# Patient Record
Sex: Male | Born: 2000 | Race: White | Hispanic: No | Marital: Single | Smoking: Never smoker
Health system: Southern US, Community
[De-identification: ages and names within clinical notes are randomized; demographics above are authoritative.]

---
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
## 2020-11-26 ENCOUNTER — Telehealth (HOSPITAL_COMMUNITY): Payer: Self-pay | Admitting: Psychiatry

## 2020-11-26 NOTE — Telephone Encounter (Signed)
D:  Kari Baars (discharge coordinator) called from [facility] to refer to pt PHP.  After case manager asked a few questions, it was determined that the most appropriate fit would be CD-IOP d/t pt's past and current hx.  A:  Scheduled pt to meet with Fulton Reek, LCSW, LCAS on 12-04-20 @ 10 a.m; arrive at 9:15 a.m for paperwork; wearing a mask.  Instructed to bring the insurance card.  Medical Records were faxed to case manager as requested.

## 2020-12-04 ENCOUNTER — Ambulatory Visit (INDEPENDENT_AMBULATORY_CARE_PROVIDER_SITE_OTHER): Payer: 59 | Admitting: Licensed Clinical Social Worker

## 2020-12-04 ENCOUNTER — Other Ambulatory Visit: Payer: Self-pay

## 2020-12-04 DIAGNOSIS — F1994 Other psychoactive substance use, unspecified with psychoactive substance-induced mood disorder: Secondary | ICD-10-CM | POA: Diagnosis not present

## 2020-12-04 DIAGNOSIS — F1020 Alcohol dependence, uncomplicated: Secondary | ICD-10-CM

## 2020-12-04 DIAGNOSIS — F902 Attention-deficit hyperactivity disorder, combined type: Secondary | ICD-10-CM

## 2020-12-04 DIAGNOSIS — F1510 Other stimulant abuse, uncomplicated: Secondary | ICD-10-CM

## 2020-12-04 DIAGNOSIS — F411 Generalized anxiety disorder: Secondary | ICD-10-CM | POA: Diagnosis not present

## 2020-12-04 NOTE — Plan of Care (Signed)
See CCA 

## 2020-12-04 NOTE — Progress Notes (Deleted)
11/21/20 Sat night; drinking durring day (at least 1 week drinking black out nightly) snorted adderall; panic attacks/manic episode (yelling at everyone); grab knife cut wrist; saw ex gf tried to commit suicide; [facility] hosp Drinking heavily this summer "always black out drinker" daily this summer Started drinking at 9 Amphettamine/cocaine heavy use around Bday (adderall) August 2022; if drinking would do stimulants  Play lacross at school  Racing thoughts all life; 1 year ago similar behaviors called adhd Therapy 2021 1 session of therapy and doc; missed online mtgs  Alcohol 20 y/o it worked for depression freshamn yr hs; wkend drinking; College 3dayswk fall; 1 day wk spring due to [employer] workedin Arts development officer; 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

## 2020-12-06 NOTE — Progress Notes (Signed)
Comprehensive Clinical Assessment (CCA) Note  12/04/2020 [facility] [id number]  Chief Complaint:  Chief Complaint  Patient presents with   Addiction Problem   Anxiety   Visit Diagnosis: alcohol use disorder, stimulant use disorder, generalized anxiety disorder, major depressive disorder    CCA Screening, Triage and Referral (STR)  Patient Reported Information How did you hear about [place]? [facility] referred Referral name: [facility]  Referral phone number: No data recorded  Whom do you see for routine medical problems? No data recorded Practice/Facility Name: No data recorded Practice/Facility Phone Number: No data recorded Name of Contact: No data recorded Contact Number: No data recorded Contact Fax Number: No data recorded Prescriber Name: No data recorded Prescriber Address (if known): No data recorded  What Is the Reason for Your Visit/Call Today? No data recorded How Long Has This Been Causing You Problems? > than 6 months  What Do You Feel Would Help You the Most Today? No data recorded  Have You Recently Been in Any Inpatient Treatment (Hospital/Detox/Crisis [facility]/28-Day Program)? Yes  Name/Location of Program/Hospital:No data recorded How Long Were You There? No data recorded When Were You Discharged? No data recorded  Have You Ever Received Services From [facility] Before? No data recorded Who Do You See at [facility]? No data recorded  Have You Recently Had Any Thoughts About Hurting Yourself? Yes  Are You Planning to Commit Suicide/Harm Yourself At This time? No   Have you Recently Had Thoughts About Hurting Someone [facility]? No  Explanation: No data recorded  Have You Used Any Alcohol or Drugs in the Past 24 Hours? No  How Long Ago Did You Use Drugs or Alcohol? No data recorded What Did You Use and How Much? No data recorded  Do You Currently Have a Therapist/Psychiatrist? No  Name of Therapist/Psychiatrist: No data  recorded  Have You Been Recently Discharged From Any Office Practice or Programs? No  Explanation of Discharge From Practice/Program: No data recorded    CCA Screening Triage Referral Assessment Type of Contact: Face-to-Face  Is this Initial or Reassessment? No data recorded Date Telepsych consult ordered in [facility]:  No data recorded Time Telepsych consult ordered in [facility]:  No data recorded  Patient Reported Information Reviewed? No data recorded Patient Left Without Being Seen? No data recorded Reason for Not Completing Assessment: No data recorded  Collateral Involvement: EHR   Does Patient Have a Court Appointed Legal Guardian? No data recorded Name and Contact of Legal Guardian: No data recorded If Minor and Not Living with Parent(s), Who has Custody? No data recorded Is CPS involved or ever been involved? Never  Is APS involved or ever been involved? Never   Patient Determined To Be At Risk for Harm To Self or Others Based on Review of Patient Reported Information or Presenting Complaint? No  Method: No data recorded Availability of Means: No data recorded Intent: No data recorded Notification Required: No data recorded Additional Information for Danger to Others Potential: No data recorded Additional Comments for Danger to Others Potential: No data recorded Are There Guns or Other Weapons in Your Home? No data recorded Types of Guns/Weapons: No data recorded Are These Weapons Safely Secured?                            No data recorded Who Could Verify You Are Able To Have These Secured: No data recorded Do You Have any Outstanding Charges, Pending Court Dates,  Parole/Probation? No data recorded Contacted To Inform of Risk of Harm To Self or Others: No data recorded  Location of Assessment: Other (comment)   Does Patient Present under Involuntary Commitment? No  IVC Papers Initial File Date: No data recorded  [place] of Residence: [place]   Patient Currently  Receiving the Following Services: No data recorded  Determination of Need: No data recorded  Options For Referral: No data recorded    CCA Biopsychosocial Intake/Chief Complaint:  Carlos Casey is a 20 y/o male presenting as a referral following D/C from [facility]. Client was admtted for SI while intoxicated and is now taking a semester off school moving back in with parents to address MH/SU concerns. Client reporrted regular drinking since age 64, inreased durring summer 2022 up "black out drinking" daily. Starting August 2022 client started using cocaine and adderall while drinking.Client reported the night of the hospitalization he had been drinking daily and blacking out nightly on a binge for around 1 week (10drinks/night), had snorted adderall (around 30mg  conparied ot normal 10-15mg ) and started having panic attacks, yelling at everybody, cut wrist with knife and when saw ex-girlfriedn threatened to committ suicide. Client reported last suicideal thoughts were more than 1 year ago. Client reports medication helping mood symptoms but that he has had symtpoms of anxiety, depression, and racing thoughts "all life" but increased since freshman year of highschool. Client reported previous diagnosis of anxiety, depression, and ADHD.  Current Symptoms/Problems: Per self reported symptoms checklist client reported: depression, anxiety (especially social), mood swings, appetite changes, racing thoughts, memory problems, irritability, ecessive worrying, SIB, panic attacks, poor concentration, hyperactivity. Client reported majority symtpoms have improved with medication, specifically racing thoughts. Client reported presented problem as "severe anxiety that has resulted in several different problems, also quick to anger and quick to calm down" Client noted having to take a semester off due to substance use and constant worrying.   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   Mental Health Symptoms Depression:   Difficulty Concentrating; Irritability; Worthlessness; Tearfulness (sleep and appettite improved after stopping adderall; hopelessness prior to medication)   Duration of Depressive symptoms:  Greater than two weeks   Mania:   Change in energy/activity; Irritability   Anxiety:    Difficulty concentrating; Worrying; Restlessness; Irritability ("a ton of social anxiety" lead to drinking)   Psychosis:   None   Duration of Psychotic symptoms: No data recorded  Trauma:   None   Obsessions:   None   Compulsions:   None   Inattention:   None; Avoids/dislikes activities that require focus; Forgetful; Poor follow-through on tasks; Disorganized; Symptoms present in 2 or more settings   Hyperactivity/Impulsivity:   Feeling of restlessness; Symptoms present before age 58; Several symptoms present in 2 of more settings   Oppositional/Defiant Behaviors:   Temper   Emotional Irregularity:   Frantic efforts to avoid abandonment; Intense/inappropriate anger; Potentially harmful impulsivity; Recurrent suicidal behaviors/gestures/threats   Other Mood/Personality Symptoms:  No data recorded   Mental Status Exam Appearance and self-care  Stature:  Average   Weight:   Average weight   Clothing:   Age-appropriate   Grooming:   Normal   Cosmetic use:   None   Posture/gait:   Normal   Motor activity:   Restless   Sensorium  Attention:   Normal   Concentration:    Anxiety interferes   Orientation:   X5   Recall/memory:   Normal   Affect and Mood  Affect:   Anxious; Congruent   Mood:   Anxious (reports depression improved with medication)   Relating  Eye contact:   Normal   Facial expression:   Responsive   Attitude toward examiner:   Cooperative   Thought and Language  Speech flow:  Clear and Coherent   Thought content:   Appropriate to Mood and Circumstances   Preoccupation:   None   Hallucinations:   None   Organization:  No data recorded  [employer] of Knowledge:   Average   Intelligence:   Average   Abstraction:   Normal   Judgement:   Fair   Dance movement psychotherapist:   Realistic   Insight:   Fair   Decision Making:   Impulsive   Social Functioning  Social Maturity:   Impulsive   Social Judgement:   "Street Smart"   Stress  Stressors:   Family conflict; Financial; School (hx family stress with dad; finances: spent more money this summer than should have and didnt have enough for the year; wouldnt get work done to go drinking;)   Coping Ability:   Human resources officer Deficits:   Responsibility; Self-control; Interpersonal (social anxiety)   Supports:   Family; Friends/Service system (teammates, parents, sister, friends, Systems developer)     Religion: Religion/Spirituality Are You A Religious Person?: Yes How Might This Affect Treatment?: i want to become more religous; was more religous before college  Leisure/Recreation: Leisure / Recreation Do You Have Hobbies?: Yes Leisure and Hobbies: Copywriter, advertising, working out  Exercise/Diet: Exercise/Diet Do You Exercise?: Yes (great stress releiver) What Type of Exercise Do You Do?: Other (Comment), Weight Training (lacross, weight lifting) How Many Times a Week Do You Exercise?: 6-7 times a week Have You Gained or Lost A Significant Amount of Weight in the Past Six Months?: Yes-Gained ("healthy, its a good thing") Number of Pounds Gained:  10 Do You Follow a Special Diet?: No Do You Have Any Trouble Sleeping?: No (improved with stopping substances and started medication)   CCA Employment/Education Employment/Work Situation: Employment / Work Situation Employment Situation: Surveyor, minerals Job has Been Impacted by Current Illness: Yes Describe how Patient's Job has Been Impacted: out of work while in school; working over the summer in multiple cities (while away from home drank more, up to black out drinking daily) What is the Longest Time Patient has 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?: No  Education: Education Is Patient Currently Attending School?: Yes School Currently Attending: [employer] Last Grade Completed: 14 Name of High School: [employer] Did Garment/textile technologist From [employer]?: Yes Did Theme park manager?: Yes What Type of College Degree Do you Have?: pending Did You Attend Graduate School?: No What Was Your Major?: pending change Did You Have An Individualized Education Program (IIEP): No Did You Have Any Difficulty At School?: No Patient's Education Has Been Impacted by Current Illness:  Yes How Does Current Illness Impact Education?: inability to consentrate, not turning in work or turning in incomplete if time to drink   CCA Family/Childhood History Family and Relationship History: Family history Marital status: Single Are you sexually active?: Yes What is your sexual orientation?: straight Has your sexual activity been affected by drugs, alcohol, medication, or emotional stress?: little by alcohol; sleeping around not being safe Does patient have children?: No  Childhood History:  Childhood History By whom was/is the patient raised?: Both parents Additional childhood history information: [place] to [place] to  [facility]; moves to [place] since 20 y/o; moved due to parent job (dad Biomedical scientist; mom: Psychologist, occupational).   3 uncles hx addiction (paternal) Description of patient's relationship with caregiver when they were a child: mom was always great-similar personalities "tastes were similar" clt similar personality with dad and butted heads in highschool Patient's description of current relationship with people who raised him/her: possive, supportive with both How were you disciplined when you got in trouble as a child/adolescent?: appropriate Does patient have siblings?: Yes Number of Siblings: 1 Description of patient's current relationship with siblings: 54 y/o sister; get along well; ssiter may have depression Did patient suffer any verbal/emotional/physical/sexual abuse as a child?: No Did patient suffer from severe childhood neglect?: No Has patient ever been sexually abused/assaulted/raped as an adolescent or adult?: No Was the patient ever a victim of a crime or a disaster?: No Witnessed domestic violence?: No Has patient been affected by domestic violence as an adult?: No  Child/Adolescent Assessment:     CCA Substance Use Alcohol/Drug Use: Alcohol / Drug Use Pain Medications: none Prescriptions: seroquel, effexor, naltrexone Over the Counter: none History of alcohol / drug use?: Yes (started alcohol use age 36, increased in HS, increased again summer 22; 6 months f 5-7days/wk drinking; previously dranks less durring lacross season; abused adderall while drinking so could drink more; self medicating anxiety/social anxiety) Longest period of sobriety (when/how long): 1 week Negative Consequences of Use: Surveyor, quantity, Work / Programmer, multimedia (spent too much money while drinking; missing assigments) Withdrawal Symptoms: Blackouts, Tremors, Nausea / Vomiting Substance #1 Name of Substance 1: alcohol 1 - Age of First Use: 15 1 - Amount (size/oz): 8-10 drinks 1 - Frequency: almost daily 1  - Duration: 6 months increased (5 years total) 1 - Last Use / Amount: 11/22/20 1 - Method of Aquiring: parties/friends 1- Route of Use: oral/drinking Substance #2 Name of Substance 2: marijuana 2 - Age of First Use: 15 2 - Amount (size/oz): "couple hits" 2 - Frequency: 3-5x weekly 2 - Duration: 5 years 2 - Last Use / Amount: 11/19/20 2 - Method of Aquiring: friends/parties 2 - Route of Substance Use: smoking joints Substance #3 Name of Substance 3: cocaine 3 - Age of First Use: 19 3 - Amount (size/oz): 1gm per 1-2 wks ((few lines the night of hospitalization)) 3 - Frequency: 3 days/wk 3 - Duration: 1 year 3 - Last Use / Amount: 11/02/20 3 - Method of Aquiring: friends/parties 3 - Route of Substance Use: snort Substance #4 Name of Substance 4: adderall (non rx) 4 - Age of First Use: 19 (non rx) 4 - Amount (size/oz): 10-15mg  4 - Frequency: almost daily 4 - Duration: 1- year 4 - Last Use / Amount: 11/21/20 4 - Method of Aquiring: non rx 4 - Route of Substance Use: snort/pill Substance #5 Name of Substance 5: nicotine 5 - Age of First Use: 15 5 - Amount (size/oz): 1 pod per  2-3 days 5 - Frequency: daily 5 - Duration: 5 years 5 - Last Use / Amount: 11/21/20 smoke; given patch at hospital; 5 - Method of Aquiring: store bought 5 - Route of Substance Use: vaping               ASAM's:  Six Dimensions of Multidimensional Assessment  Dimension 1:  Acute Intoxication and/or Withdrawal Potential:   Dimension 1:  Description of individual's past and current experiences of substance use and withdrawal: no use since being discharged; cravings for nicotine, naltrexone compliant  Dimension 2:  Biomedical Conditions and Complications:   Dimension 2:  Description of patient's biomedical conditions and  complications: denies health concerns  Dimension 3:  Emotional, Behavioral, or Cognitive Conditions and Complications:  Dimension 3:  Description of emotional, behavioral, or cognitive  conditions and complications: self medicating social anxiety, depression; clt reports decreased racing thoughts with medication  Dimension 4:  Readiness to Change:  Dimension 4:  Description of Readiness to Change criteria: identified negative consequences to substance use; willing to stop for at least the length of the program  Dimension 5:  Relapse, Continued use, or Continued Problem Potential:  Dimension 5:  Relapse, continued use, or continued problem potential critiera description: ongoing self medicationg for 5 years  Dimension 6:  Recovery/Living Environment:  Dimension 6:  Recovery/Iiving environment criteria description: staying with family, stopped school for semester, will return in the summer  ASAM Severity Score: ASAM's Severity Rating Score: 9  ASAM Recommended Level of Treatment: ASAM Recommended Level of Treatment: Level II Intensive Outpatient Treatment   Substance use Disorder (SUD) Substance Use Disorder (SUD)  Checklist Symptoms of Substance Use: Substance(s) often taken in larger amounts or over longer times than was intended, Repeated use in physically hazardous situations, Recurrent use that results in a failure to fulfill major role obligations (work, school, home), Evidence of tolerance, Evidence of withdrawal (Comment), Continued use despite having a persistent/recurrent physical/psychological problem caused/exacerbated by use  Recommendations for Services/Supports/Treatments: Recommendations for Services/Supports/Treatments Recommendations For Services/Supports/Treatments: CD-IOP Intensive Chemical Dependency Program  DSM5 Diagnoses: Patient Active Problem List   Diagnosis Date Noted   Rhabdomyolysis 09/26/2016   Acute right-sided low back pain 09/26/2016    Patient Centered Plan: Patient is on the following Treatment Plan(s):  Anxiety, Depression, and Substance Abuse Client in agreement of goals to achieve/maintain sobriety 7/7 days weekly, increase use of  distress tolerance skills to at least 1 time daily at least 4 days weekly to decrease intensity of anxiety   Referrals to Alternative Service(s): Referred to Alternative Service(s):   Place:   Date:   Time:    Referred to Alternative Service(s):   Place:   Date:   Time:    Referred to Alternative Service(s):   Place:   Date:   Time:    Referred to Alternative Service(s):   Place:   Date:   Time:     Harlon DittyKarissa A Nickson Middles[place], LCSW

## 2020-12-07 ENCOUNTER — Encounter (HOSPITAL_COMMUNITY): Payer: Self-pay | Admitting: Licensed Clinical Social Worker

## 2020-12-07 ENCOUNTER — Other Ambulatory Visit: Payer: Self-pay

## 2020-12-07 ENCOUNTER — Other Ambulatory Visit (HOSPITAL_COMMUNITY): Payer: 59 | Attending: Psychiatry | Admitting: Licensed Clinical Social Worker

## 2020-12-07 DIAGNOSIS — F1210 Cannabis abuse, uncomplicated: Secondary | ICD-10-CM | POA: Diagnosis not present

## 2020-12-07 DIAGNOSIS — F17290 Nicotine dependence, other tobacco product, uncomplicated: Secondary | ICD-10-CM

## 2020-12-07 DIAGNOSIS — Z8659 Personal history of other mental and behavioral disorders: Secondary | ICD-10-CM

## 2020-12-07 DIAGNOSIS — Z6372 Alcoholism and drug addiction in family: Secondary | ICD-10-CM | POA: Diagnosis not present

## 2020-12-07 DIAGNOSIS — F4010 Social phobia, unspecified: Secondary | ICD-10-CM | POA: Diagnosis not present

## 2020-12-07 DIAGNOSIS — F1020 Alcohol dependence, uncomplicated: Secondary | ICD-10-CM | POA: Diagnosis present

## 2020-12-07 DIAGNOSIS — F1520 Other stimulant dependence, uncomplicated: Secondary | ICD-10-CM | POA: Diagnosis not present

## 2020-12-07 DIAGNOSIS — F1410 Cocaine abuse, uncomplicated: Secondary | ICD-10-CM

## 2020-12-07 DIAGNOSIS — F1994 Other psychoactive substance use, unspecified with psychoactive substance-induced mood disorder: Secondary | ICD-10-CM | POA: Diagnosis not present

## 2020-12-07 DIAGNOSIS — Z811 Family history of alcohol abuse and dependence: Secondary | ICD-10-CM

## 2020-12-07 DIAGNOSIS — F39 Unspecified mood [affective] disorder: Secondary | ICD-10-CM

## 2020-12-07 NOTE — Progress Notes (Addendum)
Psychiatric Initial Adult Assessment   Patient Identification: Carlos Casey MRN:  [id number] Date of Evaluation:  12/07/2020 Referral Source: [facility] Chief Complaint:   Chief Complaint   Establish Care; Addiction Problem; Anxiety; Family Problem; Medication Reaction    Visit Diagnosis:    ICD-10-CM   1. Alcohol use disorder, severe, dependence (HCC)  F10.20     2. Amphetamine use disorder, severe, dependence (HCC)  F15.20     3. Cocaine abuse (HCC)  F14.10     4. Cannabis abuse, episodic use  F12.10     5. Other tobacco product nicotine dependence, uncomplicated  F17.290     6. Alcoholism and drug addiction in family  Z44.72     7. Social anxiety disorder  F40.10     8. Episodic mood disorder (HCC)  F39     9. Substance induced mood disorder (HCC)  F19.94     10. History of eating disorder  Z86.59       History of Present Illness:  20 y/o WM referred for treatment after 2 week hospitalization at [facility] ; [place], [place] for episode of binge drinking with anger outburst and threatened suicide at [facility]. He was originally taken to [facility] and subsequently voluntarily admitted himself to [facility] where he was stabilized over 2 weeks.He has now consented to come to [facility] CD IOP after returning to stay with family (M,F,sister) who live in [place]. Over the past summer his alcohol use progressed to daily black out drinking. He supplemented this with his Rx Adderall -snorting it as well as using orally at twice the prescribed dose and additional stimulant abuse with snorting cocaine. He has also been using cannabis since age 51. Underlying his chemical dependence is complaint of Social anxiety/chronic anxiety and hx of 2 threatened suicides in the context of his substance use/abuse/growing dependence. As a consequence he has created financial stress at school; spending and not earning enough for the  year and now has had to drop out of college. 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 He looked up Naltrexone side effects and found it to be rare with this medication. He did not research Effexor.  Associated Signs/Symptoms: AUDIT    Flowsheet Row Counselor from 12/04/2020 in BEHAVIORAL HEALTH OUTPATIENT THERAPY [place]  Alcohol Use Disorder Identification Test Final Score (AUDIT) 33               Flowsheet Row Counselor from 12/04/2020 in BEHAVIORAL HEALTH OUTPATIENT THERAPY [place]  C-SSRS RISK CATEGORY No Risk        ASAM's:  Six Dimensions of Multidimensional Assessment   Dimension 1:  Acute Intoxication and/or Withdrawal Potential:   Dimension 1:  Description of individual's past and current experiences of substance use and withdrawal: no use since being discharged; cravings for nicotine, naltrexone compliant  Dimension 2:  Biomedical Conditions and Complications:   Dimension 2:  Description of patient's biomedical conditions and  complications: denies health concerns  Dimension 3:  Emotional, Behavioral, or Cognitive Conditions and Complications:  Dimension 3:  Description of emotional, behavioral, or cognitive conditions and complications: self medicating social anxiety, depression; clt reports decreased racing thoughts with medication  Dimension 4:  Readiness to Change:  Dimension 4:  Description of Readiness to Change criteria: identified negative consequences to substance use; willing to stop for at least the length of the program  Dimension 5:  Relapse, Continued use, or Continued Problem  Potential:  Dimension 5:  Relapse, continued use, or continued problem potential critiera description: ongoing self medicationg for 5 years  Dimension 6:  Recovery/Living Environment:  Dimension 6:  Recovery/Iiving environment criteria description: staying with family, stopped school for semester, will return in the summer  ASAM Severity Score:  ASAM's Severity Rating Score: 9  ASAM Recommended Level of Treatment: ASAM Recommended Level of Treatment: Level II Intensive Outpatient Treatment    Substance use Disorder (SUD) Substance Use Disorder (SUD)  Checklist Symptoms of Substance Use: Substance(s) often taken in larger amounts or over longer times than was intended, Repeated use in physically hazardous situations, Recurrent use that results in a failure to fulfill major role obligations (work, school, home), Evidence of tolerance, Evidence of withdrawal (Comment), Continued use despite having a persistent/recurrent physical/psychological problem caused/exacerbated by use  Depression Symptoms:   OceanographerHQ2-9    Flowsheet Row Counselor from 12/04/2020 in BEHAVIORAL HEALTH OUTPATIENT THERAPY [place]  PHQ-2 Total Score 0  PHQ-9 Total Score 2    (Hypo) Manic Symptoms:  Use related Change in energy/activity; Irritability  Anxiety Symptoms:   GAD-7    Flowsheet Row Counselor from 12/04/2020 in BEHAVIORAL HEALTH OUTPATIENT THERAPY   Total GAD-7 Score 5  Difficulty concentrating; Worrying; Restlessness; Irritability ("a ton of social anxiety" lead to drinking)  Psychotic Symptoms:  NA PTSD Symptoms: Denies-He has hx as an Adult Child of Alcoholic  Past Psychiatric History:  2017-`18 [facility] [place] [facility]  [facility]  Previous Psychotropic Medications: Yes   Substance Abuse History in the last 12 months:  Yes.   Name of Substance 1: alcohol 1 - Age of First Use: 15 1 - Amount (size/oz): 8-10 drinks 1 - Frequency: almost daily 1 - Duration: 6 months increased (5 years total) 1 - Last Use / Amount: 11/22/20 1 - Method of Aquiring: parties/friends 1- Route of Use: oral/drinking Substance #2 Name of Substance 2: marijuana 2 - Age of First Use: 15 2 - Amount (size/oz): "couple hits" 2 - Frequency: 3-5x weekly 2 - Duration: 5 years 2 -  Last Use / Amount: 11/19/20 2 - Method of Aquiring: friends/parties 2 - Route of Substance Use: smoking joints Substance #3 Name of Substance 3: cocaine 3 - Age of First Use: 19 3 - Amount (size/oz): 1gm per 1-2 wks ((few lines the night of hospitalization)) 3 - Frequency: 3 days/wk 3 - Duration: 1 year 3 - Last Use / Amount: 11/02/20 3 - Method of Aquiring: friends/parties 3 - Route of Substance Use: snort Substance #4 Name of Substance 4: adderall (non rx) 4 - Age of First Use: 19 (non rx) 4 - Amount (size/oz): 10-15mg  4 - Frequency: almost daily 4 - Duration: 1- year 4 - Last Use / Amount: 11/21/20 4 - Method of Aquiring: non rx 4 - Route of Substance Use: snort/pill Substance #5 Name of Substance 5: nicotine 5 - Age of First Use: 15 5 - Amount (size/oz): 1 pod per 2-3 days 5 - Frequency: daily 5 - Duration: 5 years 5 - Last Use / Amount: 11/21/20 smoke; given patch at hospital; 5 - Method of Aquiring: store bought 5 - Route of Substance Use: vaping    Consequences of Substance Abuse: Medical Consequences:  Hospitalization/Suicidal Legal Consequences:  NA Family Consequences:  Concern/Codependence Blackouts:  Yes Withdrawal Symptoms:   Headaches Nausea Tremors Anxiety;insomnia  Past Medical History:   Care Every[facility]  [facility] Testicular  pain - Primary  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     Past Surgical History:  Procedure Laterality Date   TYMPANOPLASTY  x 2 Bilateral 2008       Family Psychiatric History:  Paternal Uncles (3) Alcoholism Sister Depression Brother addiction/ in recovery  Family History:  Family History  Problem Relation Age of Onset   Depression Sister    Cancer Paternal Grandmother    Alcoholism Other     Social History:   Social History   Socioeconomic History   Marital status: Single    Spouse name:    Number of children: NA   Years of education: 13   Highest education level:  1st semester sophomore [employer]  Occupational History   What is the Longest Time 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; 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 (while away from home drank more, up to black out drinking daily)  Tobacco Use   Smoking status: Vapes says quiot 8/27   Smokeless tobacco: inventory for technology  Vaping Use   Vaping Use: Name of Substance 5: nicotine 5 - Age of First Use: 15 5 - Amount (size/oz): 1 pod per 2-3 days 5 - Frequency: daily 5 - Duration: 5 years 5 - Last Use / Amount: 11/21/20 smoke; given patch at hospital; 5 - Method of Aquiring: store bought 5 - Route of Substance Use: vaping     Substance and Sexual Activity   Alcohol use: Name of Substance 1: alcohol 1 - Age of First Use: 15 1 - Amount (size/oz): 8-10 drinks 1 - Frequency: almost daily 1 - Duration: 6 months increased (5 years total) 1 - Last Use / Amount: 11/22/20 1 - Method of Aquiring: parties/friends 1- Route of Use: oral/drinking   Drug use: Name of Substance 2: marijuana 2 - Age of First Use: 15 2 - Amount (size/oz): "couple hits" 2 - Frequency: 3-5x weekly 2 - Duration: 5 years 2 - Last Use / Amount: 11/19/20 2 - Method of Aquiring: friends/parties 2 - Route of Substance Use: smoking joints Substance #3 Name of Substance 3: cocaine 3 - Age of First Use: 19 3 - Amount (size/oz): 1gm per 1-2 wks ((few lines the night of hospitalization)) 3 - Frequency: 3 days/wk 3 - Duration: 1 year 3 - Last Use / Amount: 11/02/20 3 - Method of Aquiring: friends/parties 3 - Route of Substance Use: snort Substance #4 Name of Substance 4: adderall (non rx) 4 - Age of First Use: 19 (non rx) 4 - Amount (size/oz): 10-15mg  4 - Frequency: almost daily 4 - Duration: 1- year 4 - Last Use / Amount: 11/21/20 4 - Method of Aquiring: non rx 4 - Route of Substance Use: snort/pill   Sexual activity:   Other Topics  Concern   Religion/Spirituality Are You A Religious Person?: Yes How Might This Affect Treatment?: i want to become more religous; was more religous before college    Social History Narrative   2  Dogs, mom and dad, 40 year old sister live in house with pt   Social Determinants of Corporate investment banker Strain: finances: spent more money this summer than should have and didnt have enough for the year; wouldnt get work done to go drinking;)  Food Insecurity: No  Transportation Needs: No  Physical Activity: Exercise/Diet Do You Exercise?: Yes (great stress releiver) What Type of Exercise Do You Do?: Other (Comment),  Weight Training (lacross, weight lifting) How Many Times a Week Do You Exercise?: 6-7 times a week  Stress: Stressors:   Family conflict; Financial; School (hx family stress with dad; finances: spent more money this summer than should have and didnt have enough for the year; wouldnt get work done to go drinking;)  Coping Ability:   Overwhelmed    Social Connections: Supports:   Family; Friends/Service system (teammates, parents, sister, friends, Systems developer)    Additional Social History:   Allergies:  No Known Allergies  Metabolic Disorder Labs: No results found for: HGBA1C, MPG No results found for: PROLACTIN No results found for: CHOL, TRIG, HDL, CHOLHDL, VLDL, LDLCALC No results found for: TSH  Therapeutic Level Labs:NA    Current Medications: Current Outpatient Medications  Medication Sig Dispense Refill   hydrOXYzine (VISTARIL) 25 MG capsule Take 1 capsule (25 mg total) by mouth every 4 (four) hours as needed for up to 180 doses for anxiety. 90 capsule 1   traZODone (DESYREL) 50 MG tablet Take 1 tablet (50 mg total) by mouth at bedtime as needed and may repeat dose one time if needed for up to 90 doses for sleep. 60 tablet 2   No current facility-administered medications for this visit.    Musculoskeletal: Strength & Muscle Tone: within normal  limits Gait & Station: normal Patient leans: N/A  Psychiatric Specialty Exam: Review of Systems  Constitutional:  Positive for activity change (CD IOP s/p Hospitalization). Negative for appetite change, chills, diaphoresis, fatigue, fever and unexpected weight change.  HENT:  Negative for congestion, dental problem, drooling, ear discharge, ear pain, facial swelling, hearing loss, mouth sores, nosebleeds, postnasal drip, rhinorrhea, sinus pressure, sinus pain, sneezing, sore throat, tinnitus, trouble swallowing and voice change.        COVID Vaccinated  Eyes:  Negative for photophobia, pain, discharge, redness, itching and visual disturbance.  Respiratory:  Negative for apnea, cough, choking, chest tightness, shortness of breath, wheezing and stridor.   Cardiovascular:  Negative for chest pain, palpitations and leg swelling.  Gastrointestinal:  Negative for abdominal distention, abdominal pain, anal bleeding, blood in stool, constipation, diarrhea, nausea, rectal pain and vomiting.  Endocrine: Negative for cold intolerance, heat intolerance, polydipsia and polyphagia.  Genitourinary:  Negative for decreased urine volume, difficulty urinating, dysuria, enuresis, flank pain, frequency, genital sores, hematuria, penile discharge, penile pain, penile swelling, scrotal swelling, testicular pain and urgency.       Anorgasmia (medication related)  Musculoskeletal:  Negative for arthralgias, back pain, gait problem, joint swelling, myalgias, neck pain and neck stiffness.  Skin:  Negative for color change, pallor, rash and wound.  Allergic/Immunologic: Negative for environmental allergies, food allergies and immunocompromised state.  Neurological:  Negative for dizziness, tremors, seizures, syncope, facial asymmetry, speech difficulty, weakness, light-headedness, numbness and headaches.  Hematological:  Negative for adenopathy. Does not bruise/bleed easily.  Psychiatric/Behavioral:  Positive for  behavioral problems. Negative for agitation, confusion, decreased concentration, dysphoric mood, hallucinations, self-injury, sleep disturbance and suicidal ideas. The patient is nervous/anxious (Chronic anxiety). The patient is not hyperactive.        Hx of ADHD with associate Adderal abuse/dependence with ETOH   There were no vitals taken for this visit.There is no height or weight on file to calculate BMI.  General Appearance: Casual and Neat  Eye Contact:  Good  Speech:  Clear and Coherent  Volume:  Normal  Mood:  Anxious  Affect:  Appropriate and Congruent  Thought Process:  Coherent and Descriptions of Associations: Intact  Orientation:  Full (Time, Place, and Person)  Thought Content:  WDL  Suicidal Thoughts:  No  Homicidal Thoughts:  No  Memory:  Negative  Judgement:  Impaired  Insight:   LlMITED  Psychomotor Activity:  Normal  Concentration:  Concentration: Good and Attention Span: Good  Recall:  Negative  Fund of Knowledge: WDL  Language: Good  Akathisia:  NA  Handed:  Right  AIMS (if indicated):  NA  Assets:  Desire for Improvement Financial Resources/Insurance Housing Physical Health Resilience Social Support  ADL's:  Intact  Cognition: WNL  Sleep:   No complaint   Screenings: AUDIT    Flowsheet Row Counselor from 12/04/2020 in BEHAVIORAL HEALTH OUTPATIENT THERAPY [place]  Alcohol Use Disorder Identification Test Final Score (AUDIT) 33      CAGE-AID    Flowsheet Row Counselor from 12/04/2020 in BEHAVIORAL HEALTH OUTPATIENT THERAPY [place]  CAGE-AID Score 1      GAD-7    Flowsheet Row Counselor from 12/04/2020 in BEHAVIORAL HEALTH OUTPATIENT THERAPY [place]  Total GAD-7 Score 5      PHQ2-9    Flowsheet Row Counselor from 12/04/2020 in BEHAVIORAL HEALTH OUTPATIENT THERAPY [place]  PHQ-2 Total Score 0  PHQ-9 Total Score 2      Flowsheet Row Counselor from 12/04/2020 in BEHAVIORAL HEALTH OUTPATIENT THERAPY [place]  C-SSRS RISK CATEGORY No  Risk       Assessment :    and Plan:    Maryjean Morn, PA-C 9/15/20225:51 PM

## 2020-12-07 NOTE — Progress Notes (Signed)
    Daily Group Progress Note  Program: CD-IOP   Group Time: 1pm-2pm Participation Level: Active Behavioral Response: Appropriate Type of Therapy: Process Group   Topic: Clinician checked in with client, assessing for SI/HI/psychosis and overall level of functioning including relapse challenges and barriers to recovery. Clinician and group members discussed highlights and challenges since last session related to maintaining sobriety including identified triggers and responses. Client showed progress toward goal of achieving and maintaining sobriety 7/7 days weekly AEB sobriety date remaining 11/22/20 from all substances. Client did not attend any AA/NA meetings since initial assessment. Client processed being at a party with friends who did have substances and did not feel the urge to drinking. Client showed progress toward goal of utilizing distress tolerance skills AEB leaving event when felt uncomfortable. Client and clinician processed how this skill could be used around family for the holidays. Client noted being proud of himself for being in that environment without drinking however identified it is a high risk situation he does not plan on putting himself in again.     Group Time: 2:30pm-4pm Participation Level: Active Behavioral Response: Appropriate Type of Therapy: Psycho-education Group   Topic: Clinician and group members reviewed accomplishments and needs assessment, including physical, emotional, cognitive, and social needs. Clinician and group members discussed what it looks like when their personal needs are not met including relying on substance use for needs. Clinician facilitated discussion on self-awareness of feelings, needs, and fears related to asking for help. Clinician facilitated discussion on meeting needs in healthy vs unhealthy way. Clinician and client discussed breaking unhealthy patterns of getting needs met and provided examples of helpful and unhelpful needs  instructions. Clinician inquired about self care activity to be completed prior to next session. Client met with PA for initial medication management appointment. See note for additional details.   Family Program: Family present? No   Name of family member(s): NA  UDS collected: Yes Results: pending  AA/NA attended?: No  Sponsor?: No   Olegario Messier, LCSW

## 2020-12-09 ENCOUNTER — Other Ambulatory Visit (INDEPENDENT_AMBULATORY_CARE_PROVIDER_SITE_OTHER): Payer: 59 | Admitting: Licensed Clinical Social Worker

## 2020-12-09 ENCOUNTER — Other Ambulatory Visit: Payer: Self-pay

## 2020-12-09 DIAGNOSIS — F1020 Alcohol dependence, uncomplicated: Secondary | ICD-10-CM | POA: Diagnosis not present

## 2020-12-09 DIAGNOSIS — F4010 Social phobia, unspecified: Secondary | ICD-10-CM

## 2020-12-09 DIAGNOSIS — F1520 Other stimulant dependence, uncomplicated: Secondary | ICD-10-CM | POA: Diagnosis not present

## 2020-12-09 DIAGNOSIS — F39 Unspecified mood [affective] disorder: Secondary | ICD-10-CM

## 2020-12-09 DIAGNOSIS — F1410 Cocaine abuse, uncomplicated: Secondary | ICD-10-CM | POA: Diagnosis not present

## 2020-12-09 DIAGNOSIS — F1994 Other psychoactive substance use, unspecified with psychoactive substance-induced mood disorder: Secondary | ICD-10-CM

## 2020-12-09 DIAGNOSIS — F1210 Cannabis abuse, uncomplicated: Secondary | ICD-10-CM

## 2020-12-09 NOTE — Progress Notes (Signed)
    Daily Group Progress Note  Program: CD-IOP   Group Time: 1-230  Participation Level: Active  Behavioral Response: Appropriate  Type of Therapy: Individual Therapy  Topic: Clinician checked in with client, assessing for SI/HI/psychosis and overall level of functioning, including cravings, relapse, and participation in community support meetings. Clinician and client read and processed JFT focused on secrecy. Clinician presented psycho-educational material on Distorted thinking styles. Clinician and client reviewed examples of each distortion. Clinician and client explored ways to challenge distorted thoughts. Clinician and client identified recent cognitive distortions and practiced in session creating alternate thought patterns. Clinician provided summarizing statements and praised client for identifying personal changes in perspective of situations. Clinician inquired about self-care activity to be completed before next session.    Summary: Client reported no use or craving for substances showing progress toward goal of achieving and maintaining sobriety 7/7 days weekly. Client needs continued grown toward building sober support system AEB no attendance to NA or AA meeting. Client was receptive to information on virtual meetings. Client identified personal patterns of distorted thinking and demonstrated ability utilize socratic questions to create alternative thoughts and decrease intensity of emotions.  Family Program: Family present? No   Name of family member(s): NA  UDS collected: No Results: pending  AA/NA attended?: No  Sponsor?: No   Harlon Ditty, LCSW

## 2020-12-10 ENCOUNTER — Other Ambulatory Visit (HOSPITAL_COMMUNITY): Payer: 59 | Admitting: Licensed Clinical Social Worker

## 2020-12-10 DIAGNOSIS — F1410 Cocaine abuse, uncomplicated: Secondary | ICD-10-CM

## 2020-12-10 DIAGNOSIS — F1020 Alcohol dependence, uncomplicated: Secondary | ICD-10-CM | POA: Diagnosis not present

## 2020-12-10 DIAGNOSIS — F17290 Nicotine dependence, other tobacco product, uncomplicated: Secondary | ICD-10-CM

## 2020-12-10 DIAGNOSIS — F4010 Social phobia, unspecified: Secondary | ICD-10-CM

## 2020-12-10 DIAGNOSIS — F1520 Other stimulant dependence, uncomplicated: Secondary | ICD-10-CM

## 2020-12-10 DIAGNOSIS — F1994 Other psychoactive substance use, unspecified with psychoactive substance-induced mood disorder: Secondary | ICD-10-CM

## 2020-12-10 DIAGNOSIS — F1210 Cannabis abuse, uncomplicated: Secondary | ICD-10-CM

## 2020-12-10 MED ORDER — TRAZODONE HCL 50 MG PO TABS
50.0000 mg | ORAL_TABLET | Freq: Every evening | ORAL | 2 refills | Status: AC | PRN
Start: 2020-12-10 — End: ?

## 2020-12-10 MED ORDER — HYDROXYZINE PAMOATE 25 MG PO CAPS: 25.0000 mg | ORAL_CAPSULE | ORAL | 1 refills | Status: AC | PRN

## 2020-12-10 NOTE — Progress Notes (Signed)
   [facility] Follow-up Outpatient CDIOP Date: 12/10/2020  Admission Date:12/07/2020  Sobriety date: 11/22/2020  Subjective: "Want to try sleep med"  HPI : Pt requesting to be seen for sleep aid after intake 12/07/2020 where options were discussed but h wa reluctant at the time to start any new medication.   Review of Systems: Psychiatric: Agitation: Insomnia Hallucination: No Depressed Mood: Phq 2 score 0 Insomnia: Yes Hypersomnia: No Altered Concentration: No Feels Worthless: Several days Grandiose Ideas: No Belief In Special Powers: No New/Increased Substance Abuse: No Compulsions: No  Neurologic: Headache: No Seizure: No Paresthesias: No  Current Medications: hydrOXYzine 25 MG capsule Commonly known as: Vistaril Take 1 capsule (25 mg total) by mouth every 4 (four) hours as needed for up to 180 doses for anxiety.  traZODone 50 MG tablet Commonly known as: DESYREL Take 1 tablet (50 mg total) by mouth at bedtime as needed and may repeat dose one time if needed for     Mental Status Examination  Appearance: Alert: Yes Attention: good  Cooperative: Yes Eye Contact: Good Speech: Clear and coherent Psychomotor Activity: Normal Memory/Concentration: Normal/intact Oriented: person, place, time/date and situation Mood: Euthymic Affect: Appropriate and Congruent Thought Processes and Associations: Coherent and Intact Fund of Knowledge: WDL Thought Content: Logical/Denies cravings/NO SI/HI Insight: Limited Judgement: Impaired  BWG:[id number] (probable detox metabolite ?Librium) and Nicotine  PDMP:Has no prescription for Adderall  Diagnosis:  Alcohol use disorder, severe, dependence (HCC) Amphetamine use disorder, severe, dependence (HCC) Cocaine abuse (HCC) Cannabis abuse, episodic use Social anxiety disorder Substance induced mood disorder (HCC) Other tobacco product nicotine dependence, uncomplicated  Assessment: Starting CD IOP -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

## 2020-12-14 ENCOUNTER — Other Ambulatory Visit (HOSPITAL_COMMUNITY): Payer: 59 | Admitting: Licensed Clinical Social Worker

## 2020-12-14 ENCOUNTER — Encounter (HOSPITAL_COMMUNITY): Payer: Self-pay | Admitting: Medical

## 2020-12-14 ENCOUNTER — Other Ambulatory Visit: Payer: Self-pay

## 2020-12-14 DIAGNOSIS — Z8659 Personal history of other mental and behavioral disorders: Secondary | ICD-10-CM

## 2020-12-14 DIAGNOSIS — Z79899 Other long term (current) drug therapy: Secondary | ICD-10-CM

## 2020-12-14 DIAGNOSIS — Z6372 Alcoholism and drug addiction in family: Secondary | ICD-10-CM

## 2020-12-14 DIAGNOSIS — Z811 Family history of alcohol abuse and dependence: Secondary | ICD-10-CM

## 2020-12-14 DIAGNOSIS — F1020 Alcohol dependence, uncomplicated: Secondary | ICD-10-CM

## 2020-12-14 DIAGNOSIS — F4010 Social phobia, unspecified: Secondary | ICD-10-CM

## 2020-12-14 DIAGNOSIS — F1410 Cocaine abuse, uncomplicated: Secondary | ICD-10-CM

## 2020-12-14 DIAGNOSIS — F1520 Other stimulant dependence, uncomplicated: Secondary | ICD-10-CM

## 2020-12-14 DIAGNOSIS — F17290 Nicotine dependence, other tobacco product, uncomplicated: Secondary | ICD-10-CM

## 2020-12-14 DIAGNOSIS — F1210 Cannabis abuse, uncomplicated: Secondary | ICD-10-CM

## 2020-12-14 DIAGNOSIS — F1994 Other psychoactive substance use, unspecified with psychoactive substance-induced mood disorder: Secondary | ICD-10-CM

## 2020-12-14 NOTE — Progress Notes (Deleted)
    Daily Group Progress Note  Program: {CHL AMB BH IOP/CDIOP Program Type:21022744}   Group Time: ***  Participation Level: {CHL AMB BH Group Participation:21022742}  Behavioral Response: {CHL AMB BH Group Behavior:21022743}  Type of Therapy: {CHL AMB BH Type of Therapy:21022741}  Topic: ***     Group Time: ***  Participation Level: {CHL AMB BH Group Participation:21022742}  Behavioral Response: {CHL AMB BH Group Behavior:21022743}  Type of Therapy: {CHL AMB BH Type of Therapy:21022741}  Topic: ***   Summary: ***   Family Program: Family present? {BHH YES OR NO:22294}   Name of family member(s): ***  UDS collected: {BHH YES OR NO:22294} Results: {Findings; urine drug screen:60936}  AA/NA attended?: {BHH YES OR NO:22294}{DAYS OF WEEK:22385}  Sponsor?: {BHH YES OR NO:22294}   Aiyah Scarpelli A Aishia Barkey, LCSW        

## 2020-12-14 NOTE — Progress Notes (Signed)
Patient ID: Carlos Casey, male   DOB: [birth year], 20 y.o.   MRN: [id number]   [facility] Behavioral Health Follow-up Outpatient CDIOP Date: 12/14/2020  Admission Date:12/07/2020  Sobriety date: 11/22/2020  Subjective: "The medicines are working"  HPI : CD IOP Medication management FU Pt 1 week into CDIOP with medication problems related to his discharge medications Effexor ,Naltrexone and Seroquel ,most significantly sexual dysfunction. Initially it was suggested he trial stopping his Effexor as he had already stopped the Naltrxone believing it might be the cause of his inability to acheive orgasm.He has had a problem with anxiety since childhood in his alcoholic family of origin.  He also c/o of insomnia which also started in childhood and which he was prescribed Seroquel. This n medication got him to go to sleep but did not keep him from early awakening.  He says he has had an increase in anxiety with stopping Effexor.He noted he was given Vistaril while hospitalized which seemed helpful. He also had taken Trazodone in past without any problem. He was begun on Trazodone 50 mg HS and Vistaril 25 mg PRN. He reports 100% response /success with these meds.  Review of Systems: Psychiatric: Agitation: No Hallucination: No Depressed Mood: Yes much better Insomnia: No Hypersomnia: No Altered Concentration: No Feels Worthless: No Grandiose Ideas: No Belief In Special Powers: No New/Increased Substance Abuse: No Compulsions: No  Neurologic: Headache: No Seizure: No Paresthesias: No  Current Medications: hydrOXYzine 25 MG capsule Commonly known as: Vistaril Take 1 capsule (25 mg total) by mouth every 4 (four) hours as needed for up to 180 doses for anxiety.  naloxone 4 MG/0.1ML Liqd nasal spray kit Commonly known as: NARCAN 1 spray daily.  traZODone 50 MG tablet Commonly known as: DESYREL Take 1 tablet (50 mg total) by mouth at bedtime as needed and may repeat dose one time if needed for  up to 90 doses for sleep    Vital Signs NA  Mental Status Examination  Appearance: Alert: Yes Attention: good  Cooperative: Yes Eye Contact: Good Speech: Clear and coherent Psychomotor Activity: Normal Memory/Concentration: Normal/intact Oriented: person, place, time/date and situation Mood: Euthymic Affect: Appropriate and Congruent Thought Processes and Associations: Coherent and Intact Fund of Knowledge: Good Thought Content: WDL Insight: Good Judgement: Good  KWI:OXBD recent today pending  PDMP:0  Diagnosis:  Encounter for medication management Alcohol use disorder, severe, dependence (HCC) Amphetamine use disorder, severe, dependence (HCC) Cocaine abuse ([place]) Cannabis abuse, episodic use Social anxiety disorder Substance induced mood disorder ([place]) Other tobacco product nicotine dependence, uncomplicated Family history of alcoholism in father Dysfunctional family due to alcoholism History of eating disorder  Assessment:Medication adjustments successful  Treatment Plan:Continue Trazodone and Vistaril  Per admission/FU 1 week/PRN Darlyne Russian, PA-C

## 2020-12-15 NOTE — Progress Notes (Signed)
    Daily Group Progress Note  Program: CD-IOP   Group Time: 1pm-3pm  Participation Level: Active  Behavioral Response: Appropriate, Sharing, and Motivated  Type of Therapy: Individual Therapy  Topic: Clinician checked in with client, assessed for SI/HI/psychosis and overall level of functioning including any substance use. Clinician and group member utilized "totika" game and processed self reflection questions related to recovery. Topics included self disclosure, loss, regret, and fear. Clinician inquired about client usual way of coping with anger based on behavior after losing one game.  Client met with PA-C. See note for more details.  Summary: Client presented on time, fully oriented, did not endorse SI/HI, did not appear psychotic or intoxicated at the time of assessment. Client showed progress toward goal of achieving/maintaining sobriety AEB drinking 0/7 days over the past week. Client engaged in discussions appropriately. Client reported sometimes being a poor sport when losing games and was receptive to starting to identify where felt feelings physically at different intensities.   Family Program: Family present? No   Name of family member(s): NA  UDS collected: No Results: recent uds positive for prescribed medications and nicotine  AA/NA attended?: No  Sponsor?: No   Olegario Messier, LCSW

## 2020-12-15 NOTE — Progress Notes (Signed)
    Daily Group Progress Note  Program: CD-IOP   Group Time: 1-2:30pm Participation Level: Active  Behavioral Response: Appropriate Type of Therapy: Process Group   Topic: Clinician checked in with group member, assessing for SI/HI/psychosis and overall level of functioning, including cravings, relapse, and participation in community support meetings. Clinician and client processed 'highs' and 'lows' since last group and processed challenges to sobriety and skills used to adapt. Clinician facilitated discussion around NA Daily reflection on fellowship. Clinician and client continued adding to relapse prevention plan for returning to school, identifying commonly. Clinician provided "Handful of Quiet" meditation.     Group Time: 2:30-330pm Participation Level: Active Behavioral Response: Appropriate Type of Therapy: Psycho-educational Group  Topic: Clinician presented "Addiction Neuroscience 101" video from [place] [facility] [employer]. Clinician and client discussed scientific link between addiction, dopamine, and motivation. Clinician and group members discussed examples of changes in motivation levels in their own recovery. Clinician and client brainstormed personal activities to engage in which increase moments of joy, stimulating the pleasure center of the brain. Clinician reviewed diagnosis criteria and decision fatigue based on information from video. Clinician and client reviewed local NA schedule and options for virtual meetings locally. Client identified self-care activity to support recovery to be completed prior to next session.  Client met with PA. See note for additional details.   Summary: Client presented fully oriented, did not appear intoxicated or psychotic at the time of appointment, did not endorse SI/HI during session. Client reported being late after having lunch with his mother after work. 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 Client role played refusal skills for alcohol as he will be visiting university next weekend. Client was receptive to psycho-educational information and participated appropriately in meditation, identifying breathing skills as most helpful for his anxiety previously. Client showed progress toward goal of sobriety 7/7 days weekly AEB no substance use since hospitalization, only nicotine per client report.   Family Program: Family present? No   Name of family member(s): NA  UDS collected: Yes Results: pending  AA/NA attended?: No  Sponsor?: No   Olegario Messier, LCSW

## 2020-12-16 ENCOUNTER — Other Ambulatory Visit (HOSPITAL_COMMUNITY): Payer: 59 | Admitting: Licensed Clinical Social Worker

## 2020-12-16 ENCOUNTER — Other Ambulatory Visit: Payer: Self-pay

## 2020-12-16 DIAGNOSIS — F17290 Nicotine dependence, other tobacco product, uncomplicated: Secondary | ICD-10-CM

## 2020-12-16 DIAGNOSIS — F1020 Alcohol dependence, uncomplicated: Secondary | ICD-10-CM | POA: Diagnosis not present

## 2020-12-16 DIAGNOSIS — F1210 Cannabis abuse, uncomplicated: Secondary | ICD-10-CM

## 2020-12-16 DIAGNOSIS — F4010 Social phobia, unspecified: Secondary | ICD-10-CM

## 2020-12-16 DIAGNOSIS — F1994 Other psychoactive substance use, unspecified with psychoactive substance-induced mood disorder: Secondary | ICD-10-CM

## 2020-12-16 DIAGNOSIS — F1520 Other stimulant dependence, uncomplicated: Secondary | ICD-10-CM

## 2020-12-16 DIAGNOSIS — F1410 Cocaine abuse, uncomplicated: Secondary | ICD-10-CM

## 2020-12-17 ENCOUNTER — Other Ambulatory Visit (HOSPITAL_COMMUNITY): Payer: 59 | Admitting: Licensed Clinical Social Worker

## 2020-12-17 ENCOUNTER — Other Ambulatory Visit: Payer: Self-pay

## 2020-12-17 DIAGNOSIS — F1020 Alcohol dependence, uncomplicated: Secondary | ICD-10-CM | POA: Diagnosis not present

## 2020-12-17 DIAGNOSIS — F1994 Other psychoactive substance use, unspecified with psychoactive substance-induced mood disorder: Secondary | ICD-10-CM

## 2020-12-17 DIAGNOSIS — F4010 Social phobia, unspecified: Secondary | ICD-10-CM

## 2020-12-17 DIAGNOSIS — F1410 Cocaine abuse, uncomplicated: Secondary | ICD-10-CM

## 2020-12-17 DIAGNOSIS — F1520 Other stimulant dependence, uncomplicated: Secondary | ICD-10-CM

## 2020-12-17 DIAGNOSIS — F1210 Cannabis abuse, uncomplicated: Secondary | ICD-10-CM

## 2020-12-20 NOTE — Progress Notes (Signed)
         Daily Group Progress Note   Program: CD-IOP   Group Time: 1pm-2:30pm   Participation Level: Active   Behavioral Response: Appropriate   Type of Therapy: Process Group   Topic: Clinician checked in with group members, assessing for SI/HI/psychosis and overall level of functioning, including cravings or triggers and coping skills used to deal with uncomfortable feelings. Clinician inquired about attendance at community support meetings and connection with sober support system. Clinician and group members discussed what went well and challenges to sobriety. Clinician facilitated group processing of triggers and relapses since previous session and common struggles in early sobriety, such as wanting the instant gratification, acceptance of physical/mental/emotional states sober in comparison to expectations and related disappointments or resentments.   Clinician and group members read and processed AA and NA daily reflections       Group Time: 2:30pm-4pm   Participation Level: Active   Behavioral Response: Appropriate   Type of Therapy: Psycho-education Group   Topic: Clinician presented the psycho-educational topic of Adult Children of Alcoholics (ACOA). Clinician reviewed with clients the 'Laundry List' and common thought and behavior patterns of families with substance use in the home. Clinician and group members discussed how this affected believes/behaviors in work, intimate and interpersonal relationships. Clinician and group members reviewed how to address distorted thinking in relationships with family members. Clinician provided clients with '10 Things Adult Children of Addicts Wants You To Know." Clinician inquired about self care activity planned before next session.  Summary: Client presented fully oriented with congruent mood and affect. Client did not appear intoxicated or psychotic at the time of assessment. Client did not endorse SI/HI during session. Client  discussed with group member regrets from behaviors while intoxicated which did not align with his values and the consequences. Client was receptive to psycho-education and some of laundry  list behaviors resignated however clt reported not identifying with overall subject. Client showed progress toward goal AEB  maintaining sobriety 7/7 days weekly per goal,utilizing distraction skills to help manage anxiety. Client needs continued support building AA/NA system thought reports friends strongly supporting recovery. Clinician challenged client prioritizing football game over recovery meeting.   Family Program: Family present? No   Name of family member(s): NA  UDS collected: No Results: nicotine, prescribed naltrexone  AA/NA attended?: No  Sponsor?: No   Fulton Reek, LCSW, LCAS

## 2020-12-20 NOTE — Progress Notes (Signed)
    Daily Group Progress Note  Program: CD-IOP   Group Time: 1pm-2:30pm Participation Level: Active Behavioral Response: Appropriate Type of Therapy: Process Group Topic: Clinician checked in with group members, assessing for SI/HI/psychosis and overall level of functioning, including cravings or triggers and coping skills used to deal with uncomfortable feelings. Clinician inquired about attendance at community support meetings and connection with sober support system. Clinician and group members discussed what went well and challenges to sobriety. Clinician facilitated group processing of triggers and relapses since previous session and common struggles in early sobriety. Clinician and group members read and processed AA and NA daily reflections focused on prayer and spirituality.   Group Time: 2:30pm-4pm Participation Level: Active Behavioral Response: Appropriate Type of Therapy: Psycho-education Group Topic: Clinician introduced the topics of family roles, dynamics, and adult children of alcoholics. Clinician and group watched "Under the Influence." Clinician and group members discussed behaviors identified in self or family members and dysfunction in family roles.   Summary: Client presented fully oriented with congruent mood and affect. Client did not endorse SI/HI. Client did not appear intoxicated or psychotic at the time of appointment. Client engaged with new group member and shared events leading to current treatment. Client showed progress toward goal AEB maintained sobriety 7/7 days over the past week. Client was attentive to movie and engaged in discussion however stated he was unable to relate with many characters, thought did acknowledge if not addressed his behavior patterns could escalate similar to the movie character.   Family Program: Family present? No   Name of family member(s): NA  UDS collected: No   AA/NA attended?: No  Sponsor?: No   Harlon Ditty,  LCSW

## 2020-12-21 ENCOUNTER — Other Ambulatory Visit (HOSPITAL_COMMUNITY): Payer: 59 | Admitting: Licensed Clinical Social Worker

## 2020-12-21 ENCOUNTER — Other Ambulatory Visit: Payer: Self-pay

## 2020-12-21 DIAGNOSIS — F1210 Cannabis abuse, uncomplicated: Secondary | ICD-10-CM

## 2020-12-21 DIAGNOSIS — F1020 Alcohol dependence, uncomplicated: Secondary | ICD-10-CM | POA: Diagnosis not present

## 2020-12-21 DIAGNOSIS — F1520 Other stimulant dependence, uncomplicated: Secondary | ICD-10-CM

## 2020-12-21 DIAGNOSIS — F4010 Social phobia, unspecified: Secondary | ICD-10-CM

## 2020-12-21 DIAGNOSIS — F1994 Other psychoactive substance use, unspecified with psychoactive substance-induced mood disorder: Secondary | ICD-10-CM

## 2020-12-22 NOTE — Progress Notes (Signed)
    Daily Group Progress Note  Program: CD-IOP   Group Time: 1pm-2:30pm  Participation Level: Active Behavioral Response: Appropriate Type of Therapy: Process Group Topic: Clinician checked in with group members, assessing for SI/HI/psychosis and overall level of functioning including relapse and barriers to recovery. Clinician and group members discussed highlights and struggles since last group related to maintaining sobriety including identified triggers and responses. Clinician and group members processed Daily Reflection and personal meaning to current work in recovery.   Group Time: 2:30pm-4pm Participation Level: Active Behavioral Response: Appropriate Type of Therapy: Psycho-education Group Topic: Clinician utilized review of CBT triangle and reframing unhealthy thoughts (Catch, Challenge, Change) when group members experience negative self-talk and practiced reframing in session. Clinician utilized psycho-education on "The Family Culture Related to Substance Abuse" to group members and reviewed dysfunctional family roles. Group members identified what roles they experienced in family of origin and current home. Clinician and group members explored how behaviors growing up can affect current relationship dynamics. Clinician followed up with a "Who Am I" worksheet that engaged group members to think about who they are and or who they want to be. Clinician inquired about self-care plan before the next group.   Summary: Client presented fully oriented, did not endorse SI/HI/psychosis. Client did not appear intoxicated or psychotic at the time of session. Client appeared distracted through some of the discussion and admitted mind-wandering. Client was able to identify family roles from previously viewed video and some from family or origin, but primarily from fathers family of origin. Client struggled with I am worksheet, initially dismissing project and requiring prompting for individual  questions and re-framing questions to work through frustrations and anxiety related to unknown answers.   Family Program: Family present? No   Name of family member(s): NA  UDS collected: Yes Results: pending  AA/NA attended?: No  Sponsor?: No  Fulton Reek, LCSW, LCAS

## 2020-12-23 ENCOUNTER — Encounter (HOSPITAL_COMMUNITY): Payer: Self-pay | Admitting: Licensed Clinical Social Worker

## 2020-12-23 ENCOUNTER — Other Ambulatory Visit: Payer: Self-pay

## 2020-12-23 ENCOUNTER — Other Ambulatory Visit (HOSPITAL_COMMUNITY): Payer: 59 | Admitting: Licensed Clinical Social Worker

## 2020-12-23 DIAGNOSIS — F1410 Cocaine abuse, uncomplicated: Secondary | ICD-10-CM

## 2020-12-23 DIAGNOSIS — F1520 Other stimulant dependence, uncomplicated: Secondary | ICD-10-CM

## 2020-12-23 DIAGNOSIS — F1020 Alcohol dependence, uncomplicated: Secondary | ICD-10-CM | POA: Diagnosis not present

## 2020-12-23 DIAGNOSIS — F1210 Cannabis abuse, uncomplicated: Secondary | ICD-10-CM

## 2020-12-23 DIAGNOSIS — F1994 Other psychoactive substance use, unspecified with psychoactive substance-induced mood disorder: Secondary | ICD-10-CM

## 2020-12-23 DIAGNOSIS — F4010 Social phobia, unspecified: Secondary | ICD-10-CM

## 2020-12-24 ENCOUNTER — Encounter (HOSPITAL_COMMUNITY): Payer: 59

## 2020-12-24 NOTE — Progress Notes (Signed)
    Daily Group Progress Note  Program: CD-IOP   Group Time: 1pm-2:30pm Participation Level: Active Behavioral Response: Appropriate, Sharing and Assertive Type of Therapy: Process Group Topic: Clinician checked in with group members, assessing for SI/HI/psychosis and overall level of functioning. Clinician processed with client recent successes and any barriers to recovery. Clinician and group members read AA and NA Daily Reflection focused on "love without strings' and the changes in relationships in recovery vs active addiction as well as Despair in both active addiction and sobriety.    Group Time: 2:30pm-4pm Participation Level: Active Behavioral Response: Appropriate Type of Therapy: Psycho-education Group   Topic: Clinician provided mindfulness activity, painting 'addiction' and 'recovery.' Clinician presented psycho-educational information on 'Addictive Personalities' using supporting material from The Substance Abuse and Recovery Workbook. Clinician and group members discussed personality traits relating to self-esteem, relationship dynamics, life skills, and excitement affected by addition and addictive behaviors. Clinician and group members discussed changes in risk taking in recovery vs active addiction. Clinician inquired about self-care activity to be completed prior to next session.   Summary: Client presented fully oriented, did not endorse SI/HI, did not appear psychotic or intoxicated at the time of appointment. Client showed progress toward goal of sobriety 7/7 days weekly AEB sobriety date of 12/23/20. Client needs continued support on building sober support system AEB no attendance at AA/NA meetings though has identified a meeting that will fit his schedule. 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 Client was receptive to feedback from clinician and group members. Client participated in activities and discussions  appropriately and was receptive to challenges.   Family Program: Family present? No   Name of family member(s): NA  UDS collected: No Results: naltrexone, nicotine  AA/NA attended?: No  Sponsor?: No   Harlon Ditty, LCSW

## 2020-12-28 ENCOUNTER — Ambulatory Visit (HOSPITAL_COMMUNITY): Payer: 59 | Admitting: Medical

## 2020-12-28 ENCOUNTER — Encounter (HOSPITAL_COMMUNITY): Payer: Self-pay | Admitting: Medical

## 2020-12-28 ENCOUNTER — Encounter (HOSPITAL_COMMUNITY): Payer: Self-pay | Admitting: Licensed Clinical Social Worker

## 2020-12-28 ENCOUNTER — Telehealth (HOSPITAL_COMMUNITY): Payer: Self-pay | Admitting: Licensed Clinical Social Worker

## 2020-12-28 DIAGNOSIS — F1020 Alcohol dependence, uncomplicated: Secondary | ICD-10-CM | POA: Insufficient documentation

## 2020-12-28 NOTE — Progress Notes (Addendum)
Patient ID: Carlos Casey, male   DOB: 2000-11-19, 20 y.o.   MRN: [id number]  Pt scheduled for 2 week provider FU  Failed to attend group-has not reported to Counselor since informing her he planned to visit Colleg friends. He also failed to attend Group this past Thursday.  Addendum-Counselor contacted patient-he is + for EBV infection.Told counselor he did go to [employer] but did not use and didnt feel like doing so. Counselor making arrangements to keep him up with group while recupreates

## 2020-12-30 ENCOUNTER — Encounter (HOSPITAL_COMMUNITY): Payer: 59

## 2020-12-30 ENCOUNTER — Telehealth (HOSPITAL_COMMUNITY): Payer: Self-pay | Admitting: Licensed Clinical Social Worker

## 2020-12-31 ENCOUNTER — Encounter (HOSPITAL_COMMUNITY): Payer: 59

## 2021-01-02 ENCOUNTER — Other Ambulatory Visit: Payer: Self-pay

## 2021-01-02 ENCOUNTER — Encounter (HOSPITAL_COMMUNITY): Payer: Self-pay

## 2021-01-02 ENCOUNTER — Emergency Department (HOSPITAL_COMMUNITY)
Admission: EM | Admit: 2021-01-02 | Discharge: 2021-01-02 | Disposition: A | Discharge status: Home / Self Care | Payer: Managed Care, Other (non HMO) | Attending: Emergency Medicine | Admitting: Emergency Medicine

## 2021-01-02 DIAGNOSIS — J029 Acute pharyngitis, unspecified: Secondary | ICD-10-CM | POA: Diagnosis present

## 2021-01-02 DIAGNOSIS — B2790 Infectious mononucleosis, unspecified without complication: Secondary | ICD-10-CM | POA: Diagnosis not present

## 2021-01-02 LAB — CBC WITH DIFFERENTIAL/PLATELET
Abs Immature Granulocytes: 0.08 10*3/uL — ABNORMAL HIGH (ref 0.00–0.07)
Basophils Absolute: 0.1 10*3/uL (ref 0.0–0.1)
Basophils Relative: 0 %
Eosinophils Absolute: 0.1 10*3/uL (ref 0.0–0.5)
Eosinophils Relative: 0 %
HCT: 46 % (ref 39.0–52.0)
Hemoglobin: 15.5 g/dL (ref 13.0–17.0)
Immature Granulocytes: 0 %
Lymphocytes Relative: 60 %
Lymphs Abs: 10.7 10*3/uL — ABNORMAL HIGH (ref 0.7–4.0)
MCH: 30.9 pg (ref 26.0–34.0)
MCHC: 33.7 g/dL (ref 30.0–36.0)
MCV: 91.8 fL (ref 80.0–100.0)
Monocytes Absolute: 1.7 10*3/uL — ABNORMAL HIGH (ref 0.1–1.0)
Monocytes Relative: 9 %
Neutro Abs: 5.6 10*3/uL (ref 1.7–7.7)
Neutrophils Relative %: 31 %
Platelets: 221 10*3/uL (ref 150–400)
RBC: 5.01 MIL/uL (ref 4.22–5.81)
RDW: 12.1 % (ref 11.5–15.5)
WBC: 18.2 10*3/uL — ABNORMAL HIGH (ref 4.0–10.5)
nRBC: 0 % (ref 0.0–0.2)

## 2021-01-02 LAB — COMPREHENSIVE METABOLIC PANEL
ALT: 78 U/L — ABNORMAL HIGH (ref 0–44)
AST: 61 U/L — ABNORMAL HIGH (ref 15–41)
Albumin: 3.9 g/dL (ref 3.5–5.0)
Alkaline Phosphatase: 104 U/L (ref 38–126)
Anion gap: 6 (ref 5–15)
BUN: 8 mg/dL (ref 6–20)
CO2: 28 mmol/L (ref 22–32)
Calcium: 8.7 mg/dL — ABNORMAL LOW (ref 8.9–10.3)
Chloride: 101 mmol/L (ref 98–111)
Creatinine, Ser: 1.05 mg/dL (ref 0.61–1.24)
GFR, Estimated: >60 mL/min (ref >60)
Glucose, Bld: 100 mg/dL — ABNORMAL HIGH (ref 70–99)
Potassium: 4.7 mmol/L (ref 3.5–5.1)
Sodium: 135 mmol/L (ref 135–145)
Total Bilirubin: 1.1 mg/dL (ref 0.3–1.2)
Total Protein: 7.1 g/dL (ref 6.5–8.1)

## 2021-01-02 LAB — GROUP A STREP BY PCR: Group A Strep by PCR: NOT DETECTED

## 2021-01-02 MED ORDER — PREDNISOLONE 15 MG/5ML PO SOLN: 60.0000 mg | Freq: Every day | ORAL | 0 refills | Status: AC

## 2021-01-02 MED ORDER — PREDNISONE 20 MG PO TABS
60.0000 mg | ORAL_TABLET | Freq: Once | ORAL | Status: DC
  Filled 2021-01-02: qty 3

## 2021-01-02 NOTE — Discharge Instructions (Addendum)
Get plenty of rest and drink a lot of fluids.  Take ibuprofen 400 mg every 6 hours for pain.  Gargle with salt water and use throat lozenges for pain relief.  No sports until  symptoms are completely resolved.

## 2021-01-02 NOTE — ED Provider Notes (Signed)
[facility] EMERGENCY DEPARTMENT Provider Note   CSN: [id number] Arrival date & time: 01/02/21  1021     History No chief complaint on file.   Carlos Casey is a 20 y.o. male.  HPI He has been ill for 2 weeks with a sore throat.  Diagnosed with mono as an outpatient.  He complains of increasing difficulty swallowing and taking medications.  No shortness of breath, cough, abdominal or back pain.  He is using ibuprofen for pain relief, currently.  There are no other known active modifying factors.    History reviewed. No pertinent past medical history.  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   Acute right-sided low back pain 09/26/2016    Past Surgical History:  Procedure Laterality Date   TYMPANOPLASTY Bilateral 2008   x2       Family History  Problem Relation Age of Onset   Depression Sister    Cancer Paternal Grandmother    Alcoholism Other     Social History   Tobacco Use   Smoking status: Never   Smokeless tobacco: Never  Vaping Use   Vaping Use: Former   Quit date: 11/21/2020   Substances: Nicotine  Substance Use Topics   Alcohol use: Yes    Comment: see [facility] 12/04/2020   Drug use: Yes    Types: "Crack" cocaine, Amphetamines, Methamphetamines, Other-see comments    Comment: Cannabis    Home Medications Prior to Admission medications   Medication Sig Start Date End Date Taking? Authorizing Provider  prednisoLONE (PRELONE) 15 MG/5ML SOLN Take 20 mLs (60 mg total) by mouth daily before breakfast for 5 days. 01/02/21 01/07/21 Yes Mancel Bale, MD  hydrOXYzine (VISTARIL) 25 MG capsule Take 1 capsule (25 mg total) by mouth every 4 (four) hours as needed for up to 180 doses for anxiety. 12/10/20   Court Joy, PA-C  naloxone [facility]) nasal spray 4 mg/0.1 mL 1 spray daily. 12/03/20   [provider]  traZODone (DESYREL) 50 MG  tablet Take 1 tablet (50 mg total) by mouth at bedtime as needed and may repeat dose one time if needed for up to 90 doses for sleep. 12/10/20   Court Joy, PA-C    Allergies    Patient has no known allergies.  Review of Systems   Review of Systems  All other systems reviewed and are negative.  Physical Exam Updated Vital Signs BP 112/73 (BP Location: Left Arm)   Pulse 94   Temp 98.8 F (37.1 C) (Oral)   Resp 17   SpO2 100%   Physical Exam Vitals and nursing note reviewed.  Constitutional:      General: He is not in acute distress.    Appearance: He is well-developed. He is not ill-appearing, toxic-appearing or diaphoretic.  HENT:     Head: Normocephalic and atraumatic.     Right Ear: External ear normal.     Left Ear: External ear normal.     Mouth/Throat:     Comments: Mild tonsillar erythema with erythema of the tonsillar pillars.  Mild exudate.  No necrosis.  No trismus.  No intraoral swelling. Eyes:     Conjunctiva/sclera: Conjunctivae normal.     Pupils: Pupils are equal, round, and reactive to light.  Neck:     Trachea: Phonation normal.     Comments: Mild submandibular adenopathy.  No sublingual swelling concerning for abscess. Cardiovascular:  Rate and Rhythm: Normal rate.  Pulmonary:     Effort: Pulmonary effort is normal.  Abdominal:     General: There is no distension.     Tenderness: There is abdominal tenderness (Minimal left upper quadrant tenderness without palpable mass or splenomegaly.).  Musculoskeletal:        General: Normal range of motion.     Cervical back: Normal range of motion and neck supple.  Skin:    General: Skin is warm and dry.  Neurological:     Mental Status: He is alert and oriented to person, place, and time.     Cranial Nerves: No cranial nerve deficit.     Sensory: No sensory deficit.     Motor: No abnormal muscle tone.     Coordination: Coordination normal.  Psychiatric:        Mood and Affect: Mood normal.         Behavior: Behavior normal.        Thought Content: Thought content normal.        Judgment: Judgment normal.    ED Results / Procedures / Treatments   Labs (all labs ordered are listed, but only abnormal results are displayed) Labs Reviewed  GROUP A STREP BY PCR  COMPREHENSIVE METABOLIC PANEL  CBC WITH DIFFERENTIAL/PLATELET    EKG None  Radiology No results found.  Procedures Procedures   Medications Ordered in ED Medications  predniSONE (DELTASONE) tablet 60 mg (has no administration in time range)    ED Course  I have reviewed the triage vital signs and the nursing notes.  Pertinent labs & imaging results that were available during my care of the patient were reviewed by me and considered in my medical decision making (see chart for details).    MDM Rules/Calculators/A&P                            Patient Vitals for the past 24 hrs:  BP Temp Temp src Pulse Resp SpO2  01/02/21 1052 112/73 98.8 F (37.1 C) Oral 94 17 100 %    12:31 PM Reevaluation with update and discussion. After initial assessment and treatment, an updated evaluation reveals no further complaints.  Findings discussed with parent and, all questions answered. Mancel Bale   Medical Decision Making:  This patient is presenting for evaluation of sore throat, which does require a range of treatment options, and is a complaint that involves a moderate risk of morbidity and mortality. The differential diagnoses include medical issues, complications from viral infection, secondary infection. I decided to review old records, and in summary healthy young male presenting with progressive symptoms despite symptomatic treatment for mononucleosis.  Overall healthy..  I obtained additional historical information from father at bedside.  Clinical Laboratory Tests Ordered, included  strep test . Review indicates normal.   Critical Interventions-clinical evaluation, laboratory testing, observation and  reassessment  After These Interventions, the Patient was reevaluated and was found stable for discharge.  Evaluation consistent with mono, fairly uncomplicated but prolonged.  Will give course of prednisolone, to treat symptoms and encourage rest and fluids.  CRITICAL CARE-no Performed by: Mancel Bale  Nursing Notes Reviewed/ Care Coordinated Applicable Imaging Reviewed Interpretation of Laboratory Data incorporated into ED treatment  The patient appears reasonably screened and/or stabilized for discharge and I doubt any other medical condition or other [facility] requiring further screening, evaluation, or treatment in the ED at this time prior to discharge.  Plan: Home Medications-OTC  ibuprofen; Home Treatments-rest, fluids; return here if the recommended treatment, does not improve the symptoms; Recommended follow up-PCP, PR     Final Clinical Impression(s) / ED Diagnoses Final diagnoses:  Infectious mononucleosis without complication, infectious mononucleosis due to unspecified organism    Rx / DC Orders ED Discharge Orders          Ordered    prednisoLONE (PRELONE) 15 MG/5ML SOLN  Daily before breakfast        01/02/21 1228             Mancel Bale, MD 01/02/21 1233

## 2021-01-02 NOTE — ED Triage Notes (Signed)
Patient complains of ongoing sore throat since being diagnosed with mono last Sunday. Patient noted to have red swollen tonsils but no problem handling secretions.

## 2021-01-04 ENCOUNTER — Encounter (HOSPITAL_COMMUNITY): Payer: 59

## 2021-01-06 ENCOUNTER — Encounter (HOSPITAL_COMMUNITY): Payer: 59

## 2021-01-07 ENCOUNTER — Encounter (HOSPITAL_COMMUNITY): Payer: 59

## 2021-01-11 ENCOUNTER — Encounter (HOSPITAL_COMMUNITY): Payer: 59

## 2021-01-12 ENCOUNTER — Telehealth (HOSPITAL_COMMUNITY): Payer: Self-pay | Admitting: Licensed Clinical Social Worker

## 2021-01-13 ENCOUNTER — Encounter (HOSPITAL_COMMUNITY): Payer: 59

## 2021-01-14 ENCOUNTER — Encounter (HOSPITAL_COMMUNITY): Payer: 59

## 2021-01-18 ENCOUNTER — Encounter (HOSPITAL_COMMUNITY): Payer: 59

## 2021-01-20 ENCOUNTER — Encounter (HOSPITAL_COMMUNITY): Payer: 59

## 2021-01-21 ENCOUNTER — Encounter (HOSPITAL_COMMUNITY): Payer: 59

## 2021-01-25 ENCOUNTER — Encounter (HOSPITAL_COMMUNITY): Payer: 59

## 2021-01-27 ENCOUNTER — Other Ambulatory Visit (HOSPITAL_COMMUNITY): Payer: 59 | Attending: Psychiatry | Admitting: Medical

## 2021-01-27 ENCOUNTER — Other Ambulatory Visit: Payer: Self-pay

## 2021-01-27 DIAGNOSIS — F4010 Social phobia, unspecified: Secondary | ICD-10-CM | POA: Diagnosis not present

## 2021-01-27 DIAGNOSIS — F39 Unspecified mood [affective] disorder: Secondary | ICD-10-CM | POA: Insufficient documentation

## 2021-01-27 DIAGNOSIS — F1410 Cocaine abuse, uncomplicated: Secondary | ICD-10-CM | POA: Insufficient documentation

## 2021-01-27 DIAGNOSIS — F1520 Other stimulant dependence, uncomplicated: Secondary | ICD-10-CM | POA: Insufficient documentation

## 2021-01-27 DIAGNOSIS — F1020 Alcohol dependence, uncomplicated: Secondary | ICD-10-CM | POA: Diagnosis not present

## 2021-01-27 DIAGNOSIS — F1994 Other psychoactive substance use, unspecified with psychoactive substance-induced mood disorder: Secondary | ICD-10-CM | POA: Insufficient documentation

## 2021-01-27 DIAGNOSIS — Z813 Family history of other psychoactive substance abuse and dependence: Secondary | ICD-10-CM | POA: Diagnosis not present

## 2021-01-27 DIAGNOSIS — Z6372 Alcoholism and drug addiction in family: Secondary | ICD-10-CM | POA: Diagnosis not present

## 2021-01-27 DIAGNOSIS — F1210 Cannabis abuse, uncomplicated: Secondary | ICD-10-CM | POA: Insufficient documentation

## 2021-01-27 DIAGNOSIS — F17200 Nicotine dependence, unspecified, uncomplicated: Secondary | ICD-10-CM | POA: Insufficient documentation

## 2021-01-27 DIAGNOSIS — F17290 Nicotine dependence, other tobacco product, uncomplicated: Secondary | ICD-10-CM

## 2021-01-27 DIAGNOSIS — Z811 Family history of alcohol abuse and dependence: Secondary | ICD-10-CM

## 2021-01-27 DIAGNOSIS — Z79899 Other long term (current) drug therapy: Secondary | ICD-10-CM

## 2021-01-27 DIAGNOSIS — Z8659 Personal history of other mental and behavioral disorders: Secondary | ICD-10-CM | POA: Diagnosis not present

## 2021-01-27 DIAGNOSIS — Z91199 Patient's noncompliance with other medical treatment and regimen due to unspecified reason: Secondary | ICD-10-CM

## 2021-01-28 ENCOUNTER — Encounter (HOSPITAL_COMMUNITY): Payer: 59

## 2021-02-01 ENCOUNTER — Other Ambulatory Visit: Payer: Self-pay

## 2021-02-01 ENCOUNTER — Other Ambulatory Visit (HOSPITAL_COMMUNITY): Payer: 59

## 2021-02-03 ENCOUNTER — Encounter (HOSPITAL_COMMUNITY): Payer: 59

## 2021-02-04 ENCOUNTER — Encounter (HOSPITAL_COMMUNITY): Payer: 59

## 2021-02-17 NOTE — Progress Notes (Signed)
CDIOP                                                                                                                               [facility] OUTPATIENT                                                      Discharge Summary                                                                                                                                                                     Date of Admission: 12/07/2020 [facility] [place] Date of Discharge: 01/27/2021 Sobriety Date:Claimed to be 11/06/2020 Admission Diagnosis: 1. Alcohol use disorder, severe, dependence ([place])  F10.20       2. 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 Cocaine abuse ([place])  F14.10       4. Cannabis abuse, episodic use  F12.10       5. Other tobacco product nicotine dependence, uncomplicated  D92.426       6. Alcoholism and drug addiction in family  Z22.72       80. Social anxiety disorder  F40.10       8. Episodic mood disorder (HCC)  F39       9. Substance induced mood disorder (HCC)  F19.94       10. History of eating disorder  Z86.59        Course of Treatment: Patient was erratic in attendance and continued to engage in high risk relapse situations without seeking to use outside support groups for his subtance use dependencies.He eventuaklly failed to appear for 2 weeks after being managed at distance for his Mononucleosis. He eventually contacted counselor to report he was returning to another collegge and leaving treatment.  Medications: hydrOXYzine 25 MG capsule Commonly known as: Vistaril Take 1 capsule (25 mg total) by mouth every 4 (four) hours as needed for up to 180 doses for anxiety.  naloxone 4 MG/0.1ML Liqd nasal spray kit Commonly known as: NARCAN 1 spray daily.  traZODone 50 MG tablet Commonly known as: DESYREL Take 1 tablet (50 mg  total) by mouth at bedtime as needed and may repeat dose one time if needed for up to 90 doses for sleep.   Discharge Diagnosis: Alcohol use disorder, severe, dependence ([place]) Noncompliance with treatment plan Amphetamine use disorder, severe, dependence (HCC) Cocaine abuse ([place]) Cannabis abuse, episodic use Social anxiety disorder Substance induced mood disorder ([place]) Encounter for medication management Other tobacco product nicotine dependence, uncomplicated Family history of alcoholism in father Dysfunctional family due to alcoholism History of eating disorder Episodic mood disorder ([place]) Plan of Action to Address Continuing Problems:  Goals and Activities to Help Maintain Sobriety: Stay away from people ,places and things that are triggers Continue practicing Fair Fighting rules in interpersonal conflicts. Continue alcohol and drug refusal skills and call on support system  Attend AA/NA meetings AT LEAST as often as you use  Obtain a sponsor and a home group in [place]. Recommended to higher level of care  Referrals: IN patient treatment facility of choice such as [facility] Aftercare:NA Medication management:NA Other:None  Next appointment: NA  Prognosis:Almost certain to teturn to use at some point.    Client has NOT participated in the development of this discharge plan and may receive a copy of this completed plan   Patient ID: Carlos Casey, male   DOB: 2000-04-16, 20 y.o.   MRN: [id number]

## 2021-02-23 ENCOUNTER — Encounter (HOSPITAL_COMMUNITY): Payer: Self-pay | Admitting: Medical
